# Patient Record
Sex: Female | Born: 1975 | Race: White | Hispanic: Yes | Marital: Married | Smoking: Never smoker
Health system: Southern US, Community
[De-identification: ages and names within clinical notes are randomized; demographics above are authoritative.]

## PROBLEM LIST (undated history)

## (undated) ENCOUNTER — Emergency Department (HOSPITAL_COMMUNITY): Discharge status: Absent without leave | Payer: Self-pay

---

## 2008-10-14 ENCOUNTER — Other Ambulatory Visit: Admission: RE | Admit: 2008-10-14 | Discharge: 2008-10-14 | Payer: Self-pay | Admitting: Gynecology

## 2008-10-14 ENCOUNTER — Encounter: Payer: Self-pay | Admitting: Gynecology

## 2008-10-14 ENCOUNTER — Ambulatory Visit: Payer: Self-pay | Admitting: Gynecology

## 2009-05-21 ENCOUNTER — Inpatient Hospital Stay (HOSPITAL_COMMUNITY): Admission: AD | Admit: 2009-05-21 | Discharge: 2009-06-01 | Payer: Self-pay | Admitting: Obstetrics

## 2009-05-29 ENCOUNTER — Encounter: Payer: Self-pay | Admitting: Obstetrics & Gynecology

## 2010-12-14 LAB — CBC
HCT: 25.3 % — ABNORMAL LOW (ref 36.0–46.0)
HCT: 30.1 % — ABNORMAL LOW (ref 36.0–46.0)
Hemoglobin: 12 g/dL (ref 12.0–15.0)
MCV: 95.5 fL (ref 78.0–100.0)
MCV: 95.4 fL (ref 78.0–100.0)
Platelets: 195 10*3/uL (ref 150–400)
Platelets: 230 10*3/uL (ref 150–400)
RBC: 2.64 MIL/uL — ABNORMAL LOW (ref 3.87–5.11)
RBC: 3.65 MIL/uL — ABNORMAL LOW (ref 3.87–5.11)
RBC: 3.15 MIL/uL — ABNORMAL LOW (ref 3.87–5.11)
WBC: 6.9 10*3/uL (ref 4.0–10.5)
WBC: 8.7 10*3/uL (ref 4.0–10.5)
WBC: 7.7 10*3/uL (ref 4.0–10.5)

## 2010-12-14 LAB — TYPE AND SCREEN
ABO/RH(D): O POS
Antibody Screen: NEGATIVE

## 2010-12-14 LAB — COMPREHENSIVE METABOLIC PANEL
AST: 23 U/L (ref 0–37)
Albumin: 2.5 g/dL — ABNORMAL LOW (ref 3.5–5.2)
Alkaline Phosphatase: 139 U/L — ABNORMAL HIGH (ref 39–117)
BUN: 5 mg/dL — ABNORMAL LOW (ref 6–23)
CO2: 25 mEq/L (ref 19–32)
Chloride: 108 mEq/L (ref 96–112)
Creatinine, Ser: 0.5 mg/dL (ref 0.4–1.2)
GFR calc Af Amer: >60 mL/min (ref >60)
GFR calc non Af Amer: >60 mL/min (ref >60)
Potassium: 3.5 mEq/L (ref 3.5–5.1)
Total Bilirubin: 0.1 mg/dL — ABNORMAL LOW (ref 0.3–1.2)

## 2010-12-14 LAB — URINALYSIS, ROUTINE W REFLEX MICROSCOPIC
Nitrite: NEGATIVE
Protein, ur: NEGATIVE mg/dL
Specific Gravity, Urine: 1.015 (ref 1.005–1.030)
Urobilinogen, UA: 0.2 mg/dL (ref 0.0–1.0)

## 2010-12-14 LAB — MAGNESIUM: Magnesium: 5.5 mg/dL — ABNORMAL HIGH (ref 1.5–2.5)

## 2010-12-14 LAB — ABO/RH: ABO/RH(D): O POS

## 2010-12-14 LAB — DIC (DISSEMINATED INTRAVASCULAR COAGULATION)PANEL
Platelets: 230 10*3/uL (ref 150–400)
Smear Review: NONE SEEN

## 2010-12-14 LAB — RPR: RPR Ser Ql: NONREACTIVE

## 2014-02-28 ENCOUNTER — Ambulatory Visit: Payer: Self-pay | Admitting: Gynecology

## 2014-04-08 ENCOUNTER — Ambulatory Visit (INDEPENDENT_AMBULATORY_CARE_PROVIDER_SITE_OTHER): Payer: Self-pay | Admitting: Gynecology

## 2014-04-08 ENCOUNTER — Other Ambulatory Visit (HOSPITAL_COMMUNITY)
Admission: RE | Admit: 2014-04-08 | Discharge: 2014-04-08 | Disposition: A | Discharge status: Home / Self Care | Payer: Self-pay | Source: Ambulatory Visit | Attending: Gynecology | Admitting: Gynecology

## 2014-04-08 ENCOUNTER — Encounter: Payer: Self-pay | Admitting: Gynecology

## 2014-04-08 VITALS — BP 114/70 | Ht 60.0 in | Wt 127.2 lb

## 2014-04-08 DIAGNOSIS — N979 Female infertility, unspecified: Secondary | ICD-10-CM

## 2014-04-08 DIAGNOSIS — Z01419 Encounter for gynecological examination (general) (routine) without abnormal findings: Secondary | ICD-10-CM

## 2014-04-08 DIAGNOSIS — N926 Irregular menstruation, unspecified: Secondary | ICD-10-CM

## 2014-04-08 DIAGNOSIS — Z1151 Encounter for screening for human papillomavirus (HPV): Secondary | ICD-10-CM | POA: Insufficient documentation

## 2014-04-08 LAB — CBC WITH DIFFERENTIAL/PLATELET
Basophils Absolute: 0 10*3/uL (ref 0.0–0.1)
Basophils Relative: 0 % (ref 0–1)
EOS ABS: 0.1 10*3/uL (ref 0.0–0.7)
EOS PCT: 1 % (ref 0–5)
HCT: 37.7 % (ref 36.0–46.0)
HEMOGLOBIN: 13.3 g/dL (ref 12.0–15.0)
LYMPHS ABS: 1.4 10*3/uL (ref 0.7–4.0)
LYMPHS PCT: 21 % (ref 12–46)
MCH: 31.4 pg (ref 26.0–34.0)
MCHC: 35.3 g/dL (ref 30.0–36.0)
MCV: 88.9 fL (ref 78.0–100.0)
MONOS PCT: 5 % (ref 3–12)
Monocytes Absolute: 0.3 10*3/uL (ref 0.1–1.0)
Neutro Abs: 4.7 10*3/uL (ref 1.7–7.7)
Neutrophils Relative %: 73 % (ref 43–77)
Platelets: 217 10*3/uL (ref 150–400)
RBC: 4.24 MIL/uL (ref 3.87–5.11)
RDW: 13.1 % (ref 11.5–15.5)
WBC: 6.5 10*3/uL (ref 4.0–10.5)

## 2014-04-08 LAB — COMPREHENSIVE METABOLIC PANEL
ALT: 13 U/L (ref 0–35)
AST: 13 U/L (ref 0–37)
Albumin: 4.5 g/dL (ref 3.5–5.2)
Alkaline Phosphatase: 54 U/L (ref 39–117)
BILIRUBIN TOTAL: 0.5 mg/dL (ref 0.2–1.2)
BUN: 6 mg/dL (ref 6–23)
CALCIUM: 9.1 mg/dL (ref 8.4–10.5)
CHLORIDE: 104 meq/L (ref 96–112)
CO2: 25 meq/L (ref 19–32)
CREATININE: 0.64 mg/dL (ref 0.50–1.10)
GLUCOSE: 89 mg/dL (ref 70–99)
Potassium: 3.8 mEq/L (ref 3.5–5.3)
SODIUM: 139 meq/L (ref 135–145)
TOTAL PROTEIN: 6.9 g/dL (ref 6.0–8.3)

## 2014-04-08 LAB — LIPID PANEL
CHOLESTEROL: 141 mg/dL (ref 0–200)
HDL: 51 mg/dL (ref >39)
LDL Cholesterol: 76 mg/dL (ref 0–99)
TRIGLYCERIDES: 70 mg/dL (ref <150)
Total CHOL/HDL Ratio: 2.8 Ratio
VLDL: 14 mg/dL (ref 0–40)

## 2014-04-08 LAB — TSH: TSH: 1.317 u[IU]/mL (ref 0.350–4.500)

## 2014-04-08 MED ORDER — DOXYCYCLINE HYCLATE 100 MG PO CAPS: 100.0000 mg | ORAL_CAPSULE | Freq: Two times a day (BID) | ORAL | Status: DC

## 2014-04-08 NOTE — Patient Instructions (Signed)
Espermograma (Semen Analysis) El espermograma investiga algunos aspectos de la salud de los rganos reproductivos masculinos (testculos), incluidas las posibles causas de esterilidad. Adems, este anlisis puede realizarse para determinar si una vasectoma realizada con anterioridad fue satisfactoria.  El semen es una secrecin blanquecina que se expulsa desde el pene durante la fase final del orgasmo Market researcher) y cerca del final de la relacin sexual o la masturbacin. Est compuesto por lquidos y nutrientes de la prstata, las vesculas seminales y otras glndulas. Tambin contiene espermatozoides de los testculos. Un solo espermatozoide contiene una serie completa del cdigo gentico de un hombre (cromosoma).  PREPARACIN PARA EL ESTUDIO Se tomar Truddie Coco de semen en un recipiente estril de vidrio provisto por el laboratorio, o puede recogerse en un preservativo que no contenga lubricantes ni sustancias qumicas, como espermicidas.  RESULTADOS  Es su responsabilidad retirar el resultado del [place]. Consulte en el laboratorio o en el departamento en el que fue realizado el estudio cundo y cmo podr [employer]. Comunquese con el mdico si tiene [employer] Corporation.  Rango de valores normales  Volumen: 2 a 53m.  Tiempo de licuefaccin: 20 a [id number] de la recoleccin.  Aspecto: normal.  Movilidad/ml: mayor o igual que [id number]  Espermatozoides/ml: mayor o igual que [id number]  Viscosidad: mayor o igual que 3.  Aglutinacin: mayor o igual que 3.  Supravital: mayor o igual que el 75% vivos.  Fructosa: positivo.  pH: 7,12 a 8,00.  Recuento de espermatozoides (densidad): mayor o igual que 274mlones/ml.  Movilidad espermtica: mayor o igual que el 50% a la hora.  Morfologa de los espermatozoides: Mayor que el 30% (criterio de [place] mayor que el 14%) de formas normales. Los rangos para los resultados normales pueden variar  entre diferentes laboratorios y hospitales. Consulte siempre con su mdico despus de haPsychologist, counsellingstudio para coArmed forces logistics/support/administrative officerignificado de los reJohnson [place] si los valores se consideran "dentro de los lmites normales". Significado de los reReynolds Americanl bajo nmero de espermatozoides, su movimiento anormal (movilidad) o su forma anormal (morfologa) pueden causar problemas de esterilidad masculina. El mdico leDu Pont haElectrical engineeron usted sobre la importancia y el significado de los reCaminoas como las opciones de tratamiento y la necesidad de reOptometristruebas adicionales, si fuera necesario.  Document Released: 06/16/2013 Document Revised: 01/10/2014 [facility] LLCatient Information 2015 ExKaw CityThis information is not intended to replace advice given to you by your health care provider. Make sure you discuss any questions you have with your health care provider. Histerosalpingografa (Hysterosalpingography) La histerosalpingografa es un procedimiento para observar el interior del tero y las trompas de FaAkinsDurante este procedimiento, se inyecta una sustancia de contraste en el tero, a travs de la vagina y el cuello del tero para poder visualizar el tero mientras se toman imgenes radiogrficas. Este procedimiento puede ayudar al mdico a diagnosticar tumores, adherencias o anormalidades estructurales en el tero. Generalmente, se utSouth Georgia and the South Sandwich Islandsara determinar las razones por las que una mujer no puede tener hijos (infertilidad). El procedimiento suele durar entre 15 y 3071inutos. INFORME A SU MDICO:  Cualquier alergia que tenga.  Todos los meLyondell Chemicalincluidos vitaminas, hierbas, gotas oftlmicas, cremas y medicamentos de venta libre.  Problemas previos que usted o los miUnitedHealthe su familia hayan tenido con el uso de anestsicos.  Enfermedades de laCampbell Soup Cirugas previas.  Afecciones mdicas que tenga. RIESGOS Y COMPLICACIONES  En general,  se trata de un procedimiento seguro.  Sin embargo, Photographercomo en cualquier procedimiento, pueden surgir problemas. Estos son algunos posibles problemas:  Infeccin en el revestimiento interior del tero (endometritis) o en las trompas de Falopio (salpingitis).  Dao o perforacin del tero o las trompas de Crooked CreekFalopio.  Reaccin alrgica a la sustancia de Samoacontraste utilizada para tomar la radiografa. ANTES DEL PROCEDIMIENTO   Debe planificar el procedimiento para despus que finalice su perodo, pero antes de su prxima ovulacin. Esto ocurre por lo general Centex Corporationentre los das 5 y 10 de su ltimo perodo. El Da 1 es Film/video editorel primer da de su perodo.  Consulte a su mdico si debe cambiar o suspender los medicamentos que toma habitualmente.  Podr comer y beber normalmente.  Antes de comenzar el procedimiento, vace la vejiga. PROCEDIMIENTO  Es posible que le administren un medicamento para Lexicographerrelajarse (sedante) o un analgsico de venta libre para Paramedicaliviar las molestias durante el procedimiento.  Deber acostarse en una mesa de radiografas con los pies en los estribos.  Le colocarn en la vagina un dispositivo llamado espculo. Esto permite al mdico observar el interior de la vagina hasta el cuello del tero.  El cuello del tero se lava con un jabn especial.  Luego se pasa un tubo delgado y flexible a travs del cuello del tero hasta el tero.  Por el tubo se colocar una sustancia de Bernardsvillecontraste.  Le tomarn varias radiografas a medida que el contraste pasa a travs del tero y las trompas de SumnerFalopio.  Despus del procedimiento se retira el tubo. DESPUS DEL PROCEDIMIENTO   La mayor parte de la sustancia de contraste se eliminar de la vagina naturalmente. Puede ser necesario que use un apsito sanitario.  Puede sentir clicos leves y notar una hemorragia vaginal leve. Luego de 24 horas deben desaparecer.  Pregunte cundo Hughes Supplyestarn listos los resultados. Asegrese de Aon Corporationobtener los  resultados. Document Released: 11/18/2011 Document Revised: 08/31/2013 [facility] Patient Information 2015 [place] RunExitCare, [place]LL[place]. This information is not intended to replace advice given to you by your health care provider. Make sure you discuss any questions you have with your health care provider.

## 2014-04-08 NOTE — Addendum Note (Signed)
Addended by: Richardson ChiquitoWILKINSON, KARI S on: 04/08/2014 12:18 PM   Modules accepted: Orders

## 2014-04-08 NOTE — Progress Notes (Signed)
Cori RazorOlga Lacroix [birth year] [id number]   History:    38 y.o.  for annual gyn exam whose last gynecological examination [facility]. Patient states that she has never had any abnormal Pap smear. Patient had a cesarean section in 2010 at [redacted] weeks gestation for placenta previa. Since then she has had irregular cycles which very in between 21 and 35 days and has had no form of contraception would like to get pregnant. She had been on Depo-Provera until approximately 2 years ago.  Past medical history,surgical history, family history and social history were all reviewed and documented in the EPIC chart.  Gynecologic History Patient's last menstrual period was 03/24/2014. Contraception: none Last Pap: 2013. Results were: normal Last mammogram: Not indicated. Results were: Not indicated  Obstetric History OB History  Gravida Para Term Preterm AB SAB TAB Ectopic Multiple Living  1 1  1      1     # Outcome Date GA Lbr Len/2nd Weight Sex Delivery Anes PTL Lv  1 PRE     F LTCS          ROS: A ROS was performed and pertinent positives and negatives are included in the history.  GENERAL: No fevers or chills. HEENT: No change in vision, no earache, sore throat or sinus congestion. NECK: No pain or stiffness. CARDIOVASCULAR: No chest pain or pressure. No palpitations. PULMONARY: No shortness of breath, cough or wheeze. GASTROINTESTINAL: No abdominal pain, nausea, vomiting or diarrhea, melena or bright red blood per rectum. GENITOURINARY: No urinary frequency, urgency, hesitancy or dysuria. MUSCULOSKELETAL: No joint or muscle pain, no back pain, no recent trauma. DERMATOLOGIC: No rash, no itching, no lesions. ENDOCRINE: No polyuria, polydipsia, no heat or cold intolerance. No recent change in weight. HEMATOLOGICAL: No anemia or easy bruising or bleeding. NEUROLOGIC: No headache, seizures, numbness, tingling or weakness. PSYCHIATRIC: No depression, no loss of interest in normal  activity or change in sleep pattern.     Exam: chaperone present  BP 114/70  Ht 5' (1.524 m)  Wt 127 lb 3.2 oz (57.698 kg)  BMI 24.84 kg/m2  LMP 03/24/2014  Body mass index is 24.84 kg/(m^2).  General appearance : Well developed well nourished female. No acute distress HEENT: Neck supple, trachea midline, no carotid bruits, no thyroidmegaly Lungs: Clear to auscultation, no rhonchi or wheezes, or rib retractions  Heart: Regular rate and rhythm, no murmurs or gallops Breast:Examined in sitting and supine position were symmetrical in appearance, no palpable masses or tenderness,  no skin retraction, no nipple inversion, no nipple discharge, no skin discoloration, no axillary or supraclavicular lymphadenopathy Abdomen: no palpable masses or tenderness, no rebound or guarding Extremities: no edema or skin discoloration or tenderness  Pelvic:  Bartholin, Urethra, Skene Glands: Within normal limits             Vagina: No gross lesions or discharge  Cervix: No gross lesions or discharge  Uterus  anteverted, normal size, shape and consistency, non-tender and mobile  Adnexa  Without masses or tenderness  Anus and perineum  normal   Rectovaginal  normal sphincter tone without palpated masses or tenderness             Hemoccult not indicated   Cervix required no dilatation of the endocervical canal to facilitate insertion of the Cytobrush for appropriate sampling for Pap smear.  Assessment/Plan:  38 y.o. female for annual exam with the regular cycles/oligomenorrhea. We're going to check a CBC, TSH, prolactin,  fasting lipid profile, comprehensive metabolic panel. She will call the office at the start of her next menstrual cycle so that we can coordinate a schedule an HSG as well as a semen analysis. They were then come to the office to see me 1-2 weeks after above studies to discuss course of management such as with ovulation induction medication. She was also provided with a prescription for  Vibramycin 100 mg for her to take twice a day for 3 days starting the day before the HSG. She was reminded on the importance of prenatal vitamins for neural tube defect prophylaxis. All the above was discussed in Spanish as well as written information provided. Pap smear was done today.  Note: This dictation was prepared with  Dragon/digital dictation along withSmart phrase technology. Any transcriptional errors that result from this process are unintentional.   Ok Edwards MD, 9:57 AM 04/08/2014

## 2014-04-09 LAB — URINALYSIS W MICROSCOPIC + REFLEX CULTURE
Bilirubin Urine: NEGATIVE
CASTS: NONE SEEN
CRYSTALS: NONE SEEN
GLUCOSE, UA: NEGATIVE mg/dL
HGB URINE DIPSTICK: NEGATIVE
Ketones, ur: NEGATIVE mg/dL
LEUKOCYTES UA: NEGATIVE
NITRITE: NEGATIVE
PH: 7 (ref 5.0–8.0)
Protein, ur: NEGATIVE mg/dL
SQUAMOUS EPITHELIAL / LPF: NONE SEEN
Urobilinogen, UA: 0.2 mg/dL (ref 0.0–1.0)

## 2014-04-09 LAB — PROLACTIN: PROLACTIN: 7.1 ng/mL

## 2014-04-11 LAB — URINE CULTURE: Colony Count: >=100000

## 2014-04-12 ENCOUNTER — Other Ambulatory Visit: Payer: Self-pay | Admitting: Anesthesiology

## 2014-04-12 LAB — CYTOLOGY - PAP

## 2014-04-12 MED ORDER — NITROFURANTOIN MONOHYD MACRO 100 MG PO CAPS: 100.0000 mg | ORAL_CAPSULE | Freq: Two times a day (BID) | ORAL | Status: DC

## 2014-04-19 ENCOUNTER — Telehealth: Payer: Self-pay | Admitting: *Deleted

## 2014-04-19 DIAGNOSIS — N979 Female infertility, unspecified: Secondary | ICD-10-CM

## 2014-04-19 NOTE — Telephone Encounter (Signed)
[facility] she will need to come on Monday 04/25/14 to pay $879.00 in order to have procedure on Tuesday. No sex and Vibramycin 100 mg for her to take twice a day for 3 days starting the day before the HSG. Debarah CrapeClaudia will relay to patient.

## 2014-04-19 NOTE — Telephone Encounter (Signed)
Message copied by Aura CampsWEBB, Patrcia Schnepp L on Tue Apr 19, 2014  3:46 PM ------      Message from: Jerilynn MagesSHAFFER, CLAUDIA      Created: Tue Apr 19, 2014  2:37 PM      Regarding: HSG       Please schedule HSG for JF patient. Her LMP 04-19-14. She prefers morning appointment but not on Wed Aug 19. Thx ------

## 2014-04-26 ENCOUNTER — Ambulatory Visit (HOSPITAL_COMMUNITY)
Admission: RE | Admit: 2014-04-26 | Discharge: 2014-04-26 | Disposition: A | Discharge status: Home / Self Care | Payer: Self-pay | Source: Ambulatory Visit | Attending: Gynecology | Admitting: Gynecology

## 2014-04-26 DIAGNOSIS — N979 Female infertility, unspecified: Secondary | ICD-10-CM | POA: Insufficient documentation

## 2014-04-26 MED ORDER — IOHEXOL 300 MG/ML  SOLN
20.0000 mL | Freq: Once | INTRAMUSCULAR | Status: AC | PRN
  Administered 2014-04-26: 20 mL

## 2014-04-27 ENCOUNTER — Encounter: Payer: Self-pay | Admitting: Gynecology

## 2014-05-10 ENCOUNTER — Encounter: Payer: Self-pay | Admitting: Gynecology

## 2014-05-10 ENCOUNTER — Ambulatory Visit (INDEPENDENT_AMBULATORY_CARE_PROVIDER_SITE_OTHER): Payer: Self-pay | Admitting: Gynecology

## 2014-05-10 VITALS — BP 130/80

## 2014-05-10 DIAGNOSIS — N970 Female infertility associated with anovulation: Secondary | ICD-10-CM

## 2014-05-10 MED ORDER — CLOMIPHENE CITRATE 50 MG PO TABS: ORAL_TABLET | ORAL | Status: DC

## 2014-05-10 NOTE — Progress Notes (Signed)
   87 patient who presented to the office today with her husband to discuss previous ordered test as part of her evaluation for secondary infertility. See previous note dated 04/08/2014. Recent evaluation demonstrated the following:  CBC, comprehensive metabolic panel, TSH, prolactin, and Pap smear was normal.  Semen analysis HSG were both normal.  The patient has irregular cycles whereby they occur anywhere between 21-35 days. Patient states she had difficulty getting pregnant with her first child but had no infertility evaluation or treatment until now.  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 We discussed the synchronization of her cycles with ovulation induction medication such as clomiphene citrate 100 mg daily from day 5-9 of her cycle. She will utilize the ovulation predictor kit from day 12-16 to time or intercourse. She will return to the office on day 20 or 21 or 22 for serum progesterone level. The risks benefits and pros and cons of ovulation induction medication were discussed to include hyperstimulation and multiple gestation. Instructions and literature were provided in Spanish. We also did discuss the risk involved with advanced maternal age to include aneuploidy, hypertension, diabetes, premature delivery and miscarriage.

## 2014-05-10 NOTE — Patient Instructions (Signed)
Clomiphene tablets Qu es este medicamento? El CLOMIFENO es un medicamento para la fertilidad que se [place] para aumentar la posibilidad de [place]. [place] para estimular Nurse, children's (producir un huevo maduro) adecuada durante el ciclo de la [place]. Este medicamento puede ser utilizado para otros usos; si tiene alguna pregunta consulte con su proveedor de atencin mdica o con su farmacutico. MARCAS COMERCIALES DISPONIBLES: Clomid, Serophene Qu le debo informar a mi profesional de la salud antes de tomar este medicamento? Necesita saber si usted presenta alguno de los siguientes problemas o situaciones: -enfermedad de la glndula suprarrenal -enfermedad vascular, trastorno de coagulacin sangunea -quiste en los ovarios -endometriosis -enfermedad heptica -carcinoma de ovario -enfermedad de la glndula pituitaria -sangrado vaginal que no ha sido evaluado -una reaccin alrgica o inusual al clomifeno, a otros medicamentos, alimentos, colorantes o conservantes -si est embarazada (no debe usar si est embarazada) -si est amamantando a un beb Cmo debo utilizar este medicamento? Tome este medicamento por va oral con un vaso de agua. Siga las instrucciones de la etiqueta del [place]. Tomar exactamente segn se indica y durante el nmero exacto de das para los que fue recetado. Tome sus dosis a intervalos regulares. La State Farm de las mujeres toman este medicamento durante un perodo de 5 [place], Armed forces training and education officer la duracin del tratamiento puede ajustarse en algunos casos. Su mdico le Conservation officer, nature en que debe empezar a tomar este medicamento y Ship broker las indicaciones para Tour manager. No tome su medicamento con una frecuencia mayor que la indicada. Hable con su pediatra para informarse acerca del uso de este medicamento en nios. Puede requerir atencin especial. Sobredosis: Pngase en contacto inmediatamente con un centro toxicolgico o una sala de urgencia si usted cree que  haya tomado demasiado medicamento. ATENCIN: ConAgra Foods es solo para usted. No comparta este medicamento con nadie. Qu sucede si me olvido de una dosis? Si olvida una dosis, tmela lo antes posible. Si es casi la hora de la prxima dosis, tome slo esa dosis. No tome dosis adicionales o dobles. Qu puede interactuar con este medicamento? -suplementos a base de hierbas o dietticos como cohosh azul, cohosh negro, vitex o DHEA -prasterona Puede ser que esta lista no menciona todas las posibles interacciones. Informe a su profesional de KB Home	[place] de [employer] Corporation productos a base de hierbas, medicamentos de [place] o suplementos nutritivos que est tomando. Si usted fuma, consume bebidas alcohlicas o si utiliza drogas ilegales, indqueselo tambin a su profesional de KB Home	[place]. Algunas sustancias pueden interactuar con su medicamento. A qu debo estar atento al usar Coca-Cola? Asegrese que usted sepa cmo y cundo usar Coca-Cola. Debe saber cundo est ovulando y cundo debe Boeing sexuales a fin de incrementar las posibilidades de quedar [place]. Visite a su mdico o a su profesional de la salud para chequear su evolucin peridicamente. Es posible que deba controlar sus niveles de hormonas en la sangre o que le indique alguna prueba de orina domiciliaria para Aeronautical engineer ovulacin. Trate de no faltar a las citas. En comparacin con otros tratamientos para la fertilidad, este medicamento no aumenta mucho la posibilidad de Best boy un Geneticist, molecular. Aproximadamente 5 de cada 100 mujeres que toman este medicamento tienen la posibilidad de quedar embarazadas con SPX Corporation. Si piensa que est embarazada deje de tomar este medicamento de inmediato y comunquese con su mdico o con su profesional de Technical sales engineer. Este medicamento no es para tratamientos a Barrister's clerk. La mayora de las mujeres que se  benefician del uso de este medicamento obtienen resultados dentro de los tres  primeros ciclos (meses). Su mdico o su profesional de la salud volver a evaluar su problema. Este medicamento generalmente se utiliza durante no ms de 6 ciclos de tratamiento. Puede experimentar mareos o somnolencia. No conduzca ni utilice maquinaria ni haga nada que le exija permanecer en estado de alerta hasta que sepa cmo le afecta este medicamento. No se siente ni se ponga de pie con rapidez. Esto reduce el riesgo de mareos o desmayos. El consumir bebidas alcohlicas o el fumar tabaco puede disminuir las posibilidades de quedar embarazada. Limitar o dejar de consumir alcohol o el uso de tabaco durante su tratamiento para la fertilidad. Qu efectos secundarios puedo tener al utilizar este medicamento? Efectos secundarios que debe informar a su mdico o a su profesional de la salud tan pronto como sea posible: -reacciones alrgicas como erupcin cutnea, picazn o urticarias, hinchazn de la cara, labios o lengua -problemas respiratorios -cambios en la visin -retencin de lquidos -nuseas, vmito -hinchazn o dolor plvico -dolor abdominal severo -aumento de peso repentino Efectos secundarios que, por lo general, no requieren atencin mdica (debe informarlos a su mdico o a su profesional de la salud si persisten o si son molestos): -molestia en las mamas -sofocos -molestia plvica leve -nuseas leves Puede ser que esta lista no menciona todos los posibles efectos secundarios. Comunquese a su mdico por asesoramiento mdico sobre los efectos secundarios. Usted puede informar los efectos secundarios a la FDA por telfono al 1-800-FDA-1088. Dnde debo guardar mi medicina? Mantngala fuera del alcance de los nios. Gurdela a temperatura ambiente, entre 15 y 30 grados C (59 y 86 grados F). Protjala del calor, la luz y la humedad. Deseche todo el medicamento que no haya utilizado, despus de la fecha de vencimiento. ATENCIN: Este folleto es un resumen. Puede ser que no cubra toda la  posible informacin. Si usted tiene preguntas acerca de esta medicina, consulte con su mdico, su farmacutico o su profesional de la salud.  2015, Elsevier/Gold Standard. (2007-02-10 11:32:00)  

## 2014-06-01 ENCOUNTER — Other Ambulatory Visit: Payer: Self-pay

## 2014-06-01 DIAGNOSIS — N970 Female infertility associated with anovulation: Secondary | ICD-10-CM

## 2014-06-01 LAB — PROGESTERONE: PROGESTERONE: 45.9 ng/mL

## 2014-07-11 ENCOUNTER — Encounter: Payer: Self-pay | Admitting: Gynecology

## 2014-11-02 ENCOUNTER — Encounter: Payer: Self-pay | Admitting: Gynecology

## 2014-11-02 ENCOUNTER — Ambulatory Visit (INDEPENDENT_AMBULATORY_CARE_PROVIDER_SITE_OTHER): Payer: Self-pay | Admitting: Gynecology

## 2014-11-02 VITALS — BP 120/80

## 2014-11-02 DIAGNOSIS — N970 Female infertility associated with anovulation: Secondary | ICD-10-CM

## 2014-11-02 MED ORDER — LETROZOLE 2.5 MG PO TABS: ORAL_TABLET | ORAL | Status: DC

## 2014-11-02 MED ORDER — DEXAMETHASONE 0.5 MG PO TABS: ORAL_TABLET | ORAL | Status: DC

## 2014-11-02 NOTE — Progress Notes (Signed)
   Patient is a 39 year old gravida 1 para 1 who presented to the office today to discuss management of her infertility. She was seen in the office for the first time on July 31 whereby she had been reporting that her cycles were irregular fluctuating between every 21-35 days. Patient then later returned to the office on September 1 to discuss the following:  CBC, comprehensive metabolic panel, TSH, prolactin, and Pap smear all were normal.  HSG confirming bilateral tubal patency and normal semen analysis.  Previous pregnancy was with same partner. For her oligomenorrhea/irregular cycles she was started on clomiphene citrate 100 mg day 5 through 9 with timing of her cycle with ovulation predictor kit. She stated that she did this for 4 months noted a change in the ovulation predictor kit that did not conceive. We did do a serum progesterone level during mid luteal phase and was 45.9 indicative of ovulation.  Blood pressure today was 120/80  We had a lengthy discussion today of different treatment options to include the following: It appears that she is ovulating but perhaps she may have luteinized unruptured follicle (LUF) which may be affecting her conception. We discussed starting her on dexamethasone 0.5 mg daily at bedtime from day 2-18 of her cycle and starting a different ovulation induction medication such as Femera 2.5 mg from day 3 to day 7 along with timing of intercourse on ovulation predictor kit from day 10 to day 18. We'll try this for 3 months and if she does not conceive she will then continue to use the ovulation predictor kit and timing of intercourse for the next 3 months and then she will see me for her annual exam and we will touch base at that time. We discussed the concerns about her advanced maternal age and potential risk such as infertility, hypertension, diabetes, aneuploidy, and risk of premature delivery. All these issues were discussed with the patient Spanish and greater  than 50% of the time was spent on counseling and coordinate eating care will as to infertility management on this patient with ovulatory dysfunction. The risks benefits and pros and cons of the medications were discussed.

## 2015-06-01 ENCOUNTER — Emergency Department (HOSPITAL_COMMUNITY)
Admission: EM | Admit: 2015-06-01 | Discharge: 2015-06-01 | Disposition: A | Discharge status: Home / Self Care | Payer: Self-pay | Attending: Physician Assistant | Admitting: Physician Assistant

## 2015-06-01 ENCOUNTER — Encounter (HOSPITAL_COMMUNITY): Payer: Self-pay

## 2015-06-01 ENCOUNTER — Emergency Department (HOSPITAL_COMMUNITY): Payer: Self-pay

## 2015-06-01 ENCOUNTER — Ambulatory Visit (INDEPENDENT_AMBULATORY_CARE_PROVIDER_SITE_OTHER): Payer: Self-pay | Admitting: Family Medicine

## 2015-06-01 VITALS — BP 118/78 | HR 80 | Temp 98.6°F | Resp 16 | Ht 60.5 in | Wt 125.0 lb

## 2015-06-01 DIAGNOSIS — K219 Gastro-esophageal reflux disease without esophagitis: Secondary | ICD-10-CM

## 2015-06-01 DIAGNOSIS — R42 Dizziness and giddiness: Secondary | ICD-10-CM

## 2015-06-01 DIAGNOSIS — R079 Chest pain, unspecified: Secondary | ICD-10-CM

## 2015-06-01 DIAGNOSIS — Z7982 Long term (current) use of aspirin: Secondary | ICD-10-CM | POA: Insufficient documentation

## 2015-06-01 DIAGNOSIS — R0602 Shortness of breath: Secondary | ICD-10-CM

## 2015-06-01 DIAGNOSIS — Z3202 Encounter for pregnancy test, result negative: Secondary | ICD-10-CM | POA: Insufficient documentation

## 2015-06-01 LAB — BASIC METABOLIC PANEL
Anion gap: 12 (ref 5–15)
BUN: 7 mg/dL (ref 6–20)
CALCIUM: 9.1 mg/dL (ref 8.9–10.3)
CO2: 23 mmol/L (ref 22–32)
CREATININE: 0.67 mg/dL (ref 0.44–1.00)
Chloride: 104 mmol/L (ref 101–111)
Glucose, Bld: 90 mg/dL (ref 65–99)
Potassium: 3.7 mmol/L (ref 3.5–5.1)
Sodium: 139 mmol/L (ref 135–145)

## 2015-06-01 LAB — POCT GLYCOSYLATED HEMOGLOBIN (HGB A1C): HEMOGLOBIN A1C: 5.1

## 2015-06-01 LAB — CBC
HCT: 38.7 % (ref 36.0–46.0)
Hemoglobin: 13.6 g/dL (ref 12.0–15.0)
MCH: 31.4 pg (ref 26.0–34.0)
MCHC: 35.1 g/dL (ref 30.0–36.0)
MCV: 89.4 fL (ref 78.0–100.0)
PLATELETS: 208 10*3/uL (ref 150–400)
RBC: 4.33 MIL/uL (ref 3.87–5.11)
RDW: 12.5 % (ref 11.5–15.5)
WBC: 5.1 10*3/uL (ref 4.0–10.5)

## 2015-06-01 LAB — I-STAT BETA HCG BLOOD, ED (MC, WL, AP ONLY)

## 2015-06-01 LAB — TROPONIN I

## 2015-06-01 MED ORDER — MECLIZINE HCL 50 MG PO TABS: 50.0000 mg | ORAL_TABLET | Freq: Three times a day (TID) | ORAL | Status: AC | PRN

## 2015-06-01 MED ORDER — OMEPRAZOLE 20 MG PO CPDR: 20.0000 mg | DELAYED_RELEASE_CAPSULE | Freq: Every day | ORAL | Status: AC

## 2015-06-01 NOTE — Progress Notes (Signed)
    MRN: [id number] DOB: [birth year]  Subjective:   Amber Allison is a 39 y.o. female presenting for chief complaint of Dizziness and Shortness of Breath  Reports ~1 month history of intermittent dizziness, vertigo and agitation, occasionally associated with neck pain. Dizziness generally lasts less than 30 minutes and is relieved on its own. Admits that her dizziness occurs after meals more often than not. Reports unhealthy diet, no exercise, hydrates very well. She is worried that 5 days ago, she has had 2 episodes of shob, chest pain, left arm numbness. These episodes lasted ~1 hour, she tried aspirin for 2nd episode with some relief. Today, she has had dizziness all morning but denies chest pain, shob, n/v, abdominal pain, diaphoresis, jaw pain, left arm pain, numbness or tingling. Also denies headache, double vision, blurred vision, polydipsia, polyuria, hematuria, dysuria. Denies any other aggravating or relieving factors, no other questions or concerns.  Amber Allison currently has no medications in their medication list. Also has No Known Allergies.  Amber Allison  has no past medical history on file. Also  has past surgical history that includes Cesarean section.  Objective:   Vitals: BP 118/78 mmHg  Pulse 80  Temp(Src) 98.6 F (37 C) (Oral)  Resp 16  Ht 5' 0.5" (1.537 m)  Wt 125 lb (56.7 kg)  BMI 24.00 kg/m2  SpO2 98%  LMP 05/15/2015  Physical Exam  Constitutional: She is oriented to person, place, and time. She appears well-developed and well-nourished.  HENT:  Mouth/Throat: Oropharynx is clear and moist.  Eyes: Pupils are equal, round, and reactive to light. No scleral icterus.  Neck: Normal range of motion. Neck supple. No thyromegaly present.  Cardiovascular: Normal rate, regular rhythm and intact distal pulses.  Exam reveals no gallop and no friction rub.   No murmur heard. Pulmonary/Chest: No respiratory distress. She has no wheezes. She has no rales.  Abdominal: Soft. Bowel sounds are  normal. She exhibits no distension and no mass. There is no tenderness.  Musculoskeletal: She exhibits no edema.  Lymphadenopathy:    She has no cervical adenopathy.  Neurological: She is alert and oriented to person, place, and time.  Skin: Skin is warm and dry. No rash noted. No erythema. No pallor.   ECG interpretation by Dr. Patsy Lager and PA-Mani: T-wave inversion in leads V1-V2, q-wave in Lead III. No prior ECG for comparison.  Results for orders placed or performed in visit on 06/01/15 (from the past 24 hour(s))  POCT glycosylated hemoglobin (Hb A1C)     Status: None   Collection Time: 06/01/15 10:45 AM  Result Value Ref Range   Hemoglobin A1C 5.1    Assessment and Plan :   1. Chest pain, unspecified chest pain type 2. SOB (shortness of breath) 3. Dizziness - Patient's symptoms and HPI are worrisome for ACS. Diabetes screen was negative. I counseled her on this diagnosis and advised patient go via EMS to the emergency department. Patient declined. She states she will go to [facility] with her husband.   Wallis Bamberg, PA-C [facility] [phone] 06/01/2015 10:14 AM

## 2015-06-01 NOTE — ED Provider Notes (Signed)
CSN: [id number]     Arrival date & time 06/01/15  1122 History   First MD Initiated Contact with Patient 06/01/15 1221     No chief complaint on file.    (Consider location/radiation/quality/duration/timing/severity/associated sxs/prior Treatment) Patient is a 39 y.o. female presenting with chest pain.  Chest Pain Pain location:  Substernal area Pain quality: sharp   Pain radiates to:  L arm Pain severity:  Severe Onset quality:  Gradual Duration:  5 days Timing:  Intermittent Progression:  Waxing and waning Chronicity:  New Context: at rest   Relieved by:  None tried Worsened by:  Nothing tried Ineffective treatments:  None tried Associated symptoms: nausea and shortness of breath   Associated symptoms: no abdominal pain, no cough, no diaphoresis, no fever, no headache, no near-syncope and not vomiting   Associated symptoms comment:  Left arm pain Risk factors: no birth control, no coronary artery disease, no diabetes mellitus, no high cholesterol, no hypertension, no immobilization and no smoking     History reviewed. No pertinent past medical history. Past Surgical History  Procedure Laterality Date  . Cesarean section     Family History  Problem Relation Age of Onset  . Hypertension Mother    Social History  Substance Use Topics  . Smoking status: Never Smoker   . Smokeless tobacco: Never Used  . Alcohol Use: No   OB History    Gravida Para Term Preterm AB TAB SAB Ectopic Multiple Living   Review of Systems  Constitutional: Negative for fever, chills and diaphoresis.  HENT: Negative for congestion and sore throat.   Eyes: Negative for visual disturbance.  Respiratory: Positive for shortness of breath. Negative for cough and wheezing.   Cardiovascular: Positive for chest pain. Negative for leg swelling and near-syncope.  Gastrointestinal: Positive for nausea. Negative for vomiting, abdominal pain, diarrhea and constipation.  Genitourinary:  Negative for dysuria, difficulty urinating and vaginal pain.  Musculoskeletal: Negative for myalgias and arthralgias.  Skin: Negative for rash.  Neurological: Negative for syncope and headaches.  Psychiatric/Behavioral: Negative for behavioral problems.  All other systems reviewed and are negative.     Allergies  Review of patient's allergies indicates no known allergies.  Home Medications   Prior to Admission medications   Medication Sig Start Date End Date Taking? Authorizing Provider  aspirin EC 81 MG tablet Take 324 mg by mouth daily.   Yes Historical Provider, MD  meclizine (ANTIVERT) 50 MG tablet Take 1 tablet (50 mg total) by mouth 3 (three) times daily as needed. 06/01/15   Beverely Risen, MD  omeprazole (PRILOSEC) 20 MG capsule Take 1 capsule (20 mg total) by mouth daily. 06/01/15   Beverely Risen, MD   BP 118/75 mmHg  Pulse 78  Temp(Src) 98.7 F (37.1 C) (Oral)  Resp 18  Ht 5' (1.524 m)  Wt 125 lb (56.7 kg)  BMI 24.41 kg/m2  SpO2 100%  LMP 05/15/2015 Physical Exam  Constitutional: She is oriented to person, place, and time. She appears well-developed and well-nourished. No distress.  HENT:  Head: Normocephalic and atraumatic.  Eyes: EOM are normal.  Neck: Normal range of motion.  Cardiovascular: Normal rate, regular rhythm and normal heart sounds.   No murmur heard. Pulmonary/Chest: Effort normal and breath sounds normal. No respiratory distress. She has no wheezes.  Abdominal: Soft. There is no tenderness.  Musculoskeletal: She exhibits no edema.  Neurological: She is alert and oriented  to person, place, and time. She has normal strength and normal reflexes. No cranial nerve deficit or sensory deficit. She displays a negative Romberg sign. Coordination and gait normal. GCS eye subscore is 4. GCS verbal subscore is 5. GCS motor subscore is 6.  Skin: She is not diaphoretic.  Psychiatric: She has a normal mood and affect. Her behavior is normal.    ED Course   Procedures (including critical care time) Labs Review Labs Reviewed  BASIC METABOLIC PANEL  CBC  TROPONIN I  I-STAT BETA HCG BLOOD, ED ([facility], [facility], [facility] ONLY)    Imaging Review Dg Chest 2 View  06/01/2015   CLINICAL DATA:  Chest pain and shortness of breath for 4 days.  EXAM: CHEST  2 VIEW  COMPARISON:  None.  FINDINGS: The heart size and mediastinal contours are within normal limits. Both lungs are clear. No evidence of pneumothorax or pleural effusion. The visualized skeletal structures are unremarkable.  IMPRESSION: Negative.  No active cardiopulmonary disease.   Electronically Signed   By: Myles Rosenthal M.D.   On: 06/01/2015 13:53   I have personally reviewed and evaluated these images and lab results as part of my medical decision-making.   EKG Interpretation None      MDM   Final diagnoses:  Gastroesophageal reflux disease without esophagitis  Vertigo     Patient is a 39 year old female that presents with one year of intermittent epigastric pain worse after lying flat and eating with a bitter taste in her mouth in the morning and intermittent cough consistent with GERD. Patient also presents with 5 days of intermittent vertigo worse with head movement. On arrival to the ED there is confusion as the patient is Spanish-speaking only and it was interpreted as the patient was currently having chest pain. A chest pain workup was done which was unremarkable. Patient's EKG at urgent care showed indeterminate T waves in the septal leads however repeat EKG here is normal sinus rhythm without any ischemic change and not concerning for a septal infarct. Patient's labs for ACS are all unremarkable. On physical exam the patient has a completely normal neurologic exam. A Dix-Hallpike was performed which reproduced the patient's symptoms were going to the left however there was no nystagmus. His felt this is likely BPV. We will give the patient information establish with a PCP, meclizine, start on a acid  reflux regimen.    Beverely Risen, MD 06/01/15 1545  Courteney Randall An, MD 06/01/15 1616

## 2015-06-01 NOTE — Patient Instructions (Signed)
Dolor de pecho (no específico) °(Chest Pain (Nonspecific)) °Con frecuencia es difícil dar un diagnóstico específico de la causa del dolor de pecho. Siempre hay una posibilidad de que el dolor podría estar relacionado con algo grave, como un ataque al corazón o un coágulo sanguíneo en los pulmones. Debe someterse a controles con el médico para más evaluaciones. °CAUSAS  °· Acidez. °· Neumonía o bronquitis. °· Ansiedad o estrés. °· Inflamación de la zona que rodea al corazón (pericarditis) o a los pulmones (pleuritis o pleuresía). °· Un coágulo sanguíneo en el pulmón. °· Colapso de un pulmón (neumotórax), que puede aparecer de manera repentina por sí solo (neumotórax espontáneo) o debido a un traumatismo en el tórax. °· Culebrilla (virus del herpes zóster). °La pared torácica está compuesta por huesos, músculos y cartílago. Cualquiera de estos puede ser la fuente del dolor. °· Puede haber una contusión en los huesos debido a una lesión. °· Puede haber un esguince en los músculos o el cartílago ocasionado por la tos o por trabajo excesivo. °· El cartílago puede verse afectado por una inflamación y provocar dolor (costocondritis). °DIAGNÓSTICO  °Quizás se necesiten análisis de laboratorio u otros estudios para encontrar la causa del dolor. Además, puede indicarle que se haga una prueba llamada electrocadiograma (ECG) ambulatorio. El ECG registra los patrones de los latidos cardíacos durante 24 horas. Además, pueden hacerle otros estudios, por ejemplo: °· Ecocardiograma transtorácico (ETT). Durante el ecocardiograma, se usan ondas sonoras para evaluar el flujo de la sangre a través del corazón. °· Ecocardiograma transesofágico (ETE). °· Monitoreo cardíaco. Permite que el médico controle la frecuencia y el ritmo cardíaco en tiempo real. °· Monitor Holter. Es un dispositivo portátil que registra los latidos cardíacos y ayuda a diagnosticar las arritmias cardíacas. Le permite al médico registrar la actividad cardíaca  durante varios días, si es necesario. °· Pruebas de estrés por ejercicio o por medicamentos que aceleran los latidos cardíacos. °TRATAMIENTO  °· El tratamiento depende de la causa del dolor de pecho. El tratamiento puede incluir: °¨ Inhibidores de la acidez estomacal. °¨ Antiinflamatorios. °¨ Analgésicos para las enfermedades inflamatorias. °¨ Antibióticos, si hay una infección. °· Podrán aconsejarle que modifique su estilo de vida. Esto incluye dejar de fumar y evitar el alcohol, la cafeína y el chocolate. °· Pueden aconsejarle que mantenga la cabeza levantada (elevada) cuando duerme. Esto reduce la probabilidad de que el ácido retroceda del estómago al esófago. °En la mayoría de los casos, el dolor de pecho no específico mejorará en el término de 2 a 3 días, con reposo y analgésicos suaves.  °INSTRUCCIONES PARA EL CUIDADO EN EL HOGAR  °· Si le prescriben antibióticos, tómelos tal como se le indicó. Termínelos aunque comience a sentirse mejor. °· Durante los días siguientes, no haga actividades físicas que provoquen dolor de pecho. Continúe con las actividades físicas tal como se le indicó °· No consuma ningún producto que contenga tabaco, incluidos cigarrillos, tabaco de mascar o cigarrillos electrónicos. °· Evite el consumo de alcohol. °· Tome los medicamentos solamente como se lo haya indicado el médico. °· Siga las sugerencias del médico en lo que respecta a las pruebas adicionales, si el dolor de pecho no desaparece. °· Concurra a todas las visitas de control programadas. Si no lo hace, podría desarrollar problemas permanentes (crónicos) relacionados con el dolor. Si hay algún problema para concurrir a una cita, llame para reprogramarla. °SOLICITE ATENCIÓN MÉDICA SI:  °· El dolor de pecho no desaparece, incluso después del tratamiento. °· Tiene una erupción cutánea con ampollas en el   pecho. °· Tiene fiebre. °SOLICITE ATENCIÓN MÉDICA DE INMEDIATO SI:  °· Aumenta el dolor de pecho o este se irradia hacia el  brazo, el cuello, la mandíbula, la espalda o el abdomen. °· Le falta el aire. °· La tos empeora, o expectora sangre. °· Siente dolor intenso en la espalda o el abdomen. °· Se siente nauseoso o vomita. °· Siente debilidad intensa. °· Se desmaya. °· Tiene escalofríos. °Esto es una emergencia. No espere a ver si el dolor se pasa. Obtenga ayuda médica de inmediato. Llame a los servicios de emergencia locales (911 en los [place]). No conduzca por sus propios medios hasta el hospital. °ASEGÚRESE DE QUE:  °· Comprende estas instrucciones. °· Controlará su afección. °· Recibirá ayuda de inmediato si no mejora o si empeora. °Document Released: 08/26/2005 Document Revised: 08/31/2013 °ExitCare® Patient Information ©2015 [employer]. This information is not intended to replace advice given to you by your health care provider. Make sure you discuss any questions you have with your health care provider. ° °

## 2015-06-01 NOTE — ED Notes (Signed)
Pt presents with report of chest pain and shortness of breath that began on Sunday.  Pain is intermittent and reports pain radiates into L arm.  Pt seen at Urgent Care and referred here.

## 2015-06-01 NOTE — Discharge Instructions (Signed)
Vértigo postural benigno °(Benign Positional Vertigo) ° Vértigo es la sensación de que el entorno se mueve estando quieto. Es la forma más frecuente de vértigo. Benigno significa que la causa del trastorno no es grave. Es más frecuente en adultos mayores. °CAUSAS  °Es el resultado de un trastorno en el sistema laberíntico. Es una zona en el oído medio que ayuda a controlar el equilibrio. La causa puede ser una infección viral, una lesión en la cabeza o un movimiento repetitivo. Sin embargo, a menudo no se halla causa.  °SÍNTOMAS  °Los síntomas de vértigo posicional benigno se producen al mover la cabeza o los ojos en diferentes direcciones. Algunos de los síntomas pueden ser:  °· Pérdida de equilibrio y caídas. °· Vómitos. °· Visión borrosa. °· Mareos. °· Náuseas. °· Movimientos oculares involuntarios (nistagmus). °DIAGNÓSTICO  °El vértigo postural benigno se diagnostica mediante un examen físico. Si la causa específica de su vértigo posicional benigno es desconocido, su médico puede indicar diagnósticos por imágenes, como la resonancia magnética (RM) o la tomografía computada (TC).  °TRATAMIENTO  °El médico le podrá recomendar movimientos o procedimientos para corregir el vértigo posicional benigno. Para tratar los síntomas pueden indicarse medicamentos como meclizina, benzodiazepinas y medicamentos para las náuseas. En casos raros, si los síntomas son causados   por ciertos trastornos que afectan el oído interno, es posible que necesite cirugía.  °INSTRUCCIONES PARA EL CUIDADO EN EL HOGAR  °· Siga las indicaciones del médico. °· Muévase lentamente. No haga movimientos bruscos con la cabeza ni el cuerpo. °· Evite conducir vehículos. °· Evite operar maquinarias pesadas. °· Evite realizar tareas que serían peligrosas para usted u otras personas durante un episodio de vértigo. °· Debe ingerir gran cantidad de líquido para mantener la orina de tono claro o color amarillo pálido. °SOLICITE ATENCIÓN MÉDICA DE INMEDIATO  SI:  °· Tiene dificultad para hablar, caminar, siente debilidad o tiene problemas para usar los brazos, las manos o las piernas. °· Tiene dificultad para respirar. °· Sufre un dolor de cabeza intenso. °· Las náuseas o los vómitos persisten o empeoran. °· Tiene cambios en la visión. °· Sus familiares o amigos notan cambios en su conducta. °· El dolor empeora. °· Tiene fiebre. °· Comienza a sentir rigidez en el cuello o sensibilidad a la luz. °ASEGÚRESE DE QUE:  °· Comprende estas instrucciones. °· Controlará su enfermedad. °· Solicitará ayuda de inmediato si no mejora o si empeora. °Document Released: 12/12/2008 Document Revised: 11/18/2011 °ExitCare® Patient Information ©2015 [employer]. This information is not intended to replace advice given to you by your health care provider. Make sure you discuss any questions you have with your health care provider. ° °

## 2016-01-11 IMAGING — RF DG HYSTEROGRAM
3 series · 3 of 3 positions shown · IV contrast (omnipaque)
Comparison: None.

FLUOROSCOPY TIME:  18 seconds

CLINICAL DATA: Infertility

EXAM:
HYSTEROSALPINGOGRAM
TECHNIQUE: Following cleansing of the cervix and vagina with Betadine solution,
a hysterosalpingogram was performed using a 5-French
hysterosalpingogram catheter and Omnipaque 300 contrast. The patient
tolerated the examination without difficulty.

[Series 1: run · 1 of 1 slices shown (1 of 3)]
[im 1/1]
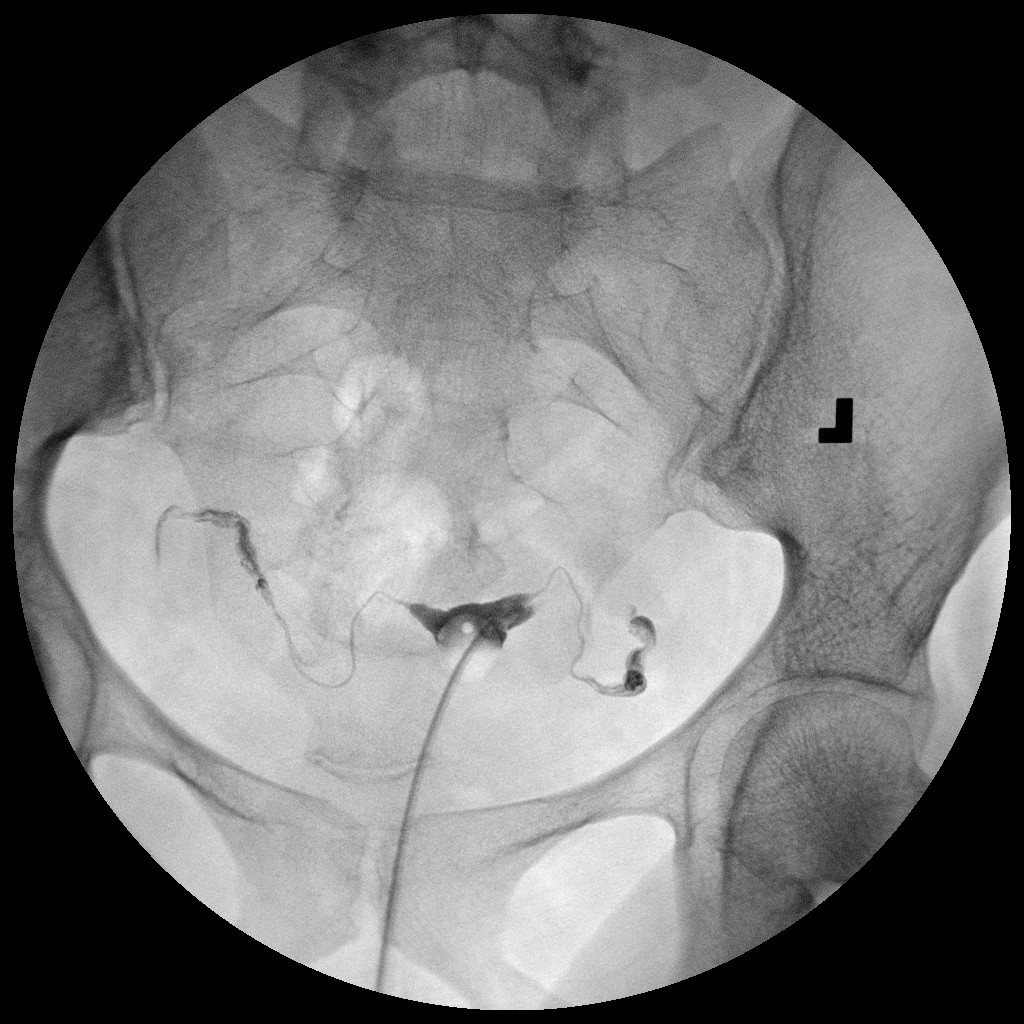

[Series 2: run · 1 of 1 slices shown (2 of 3)]
[im 1/1]
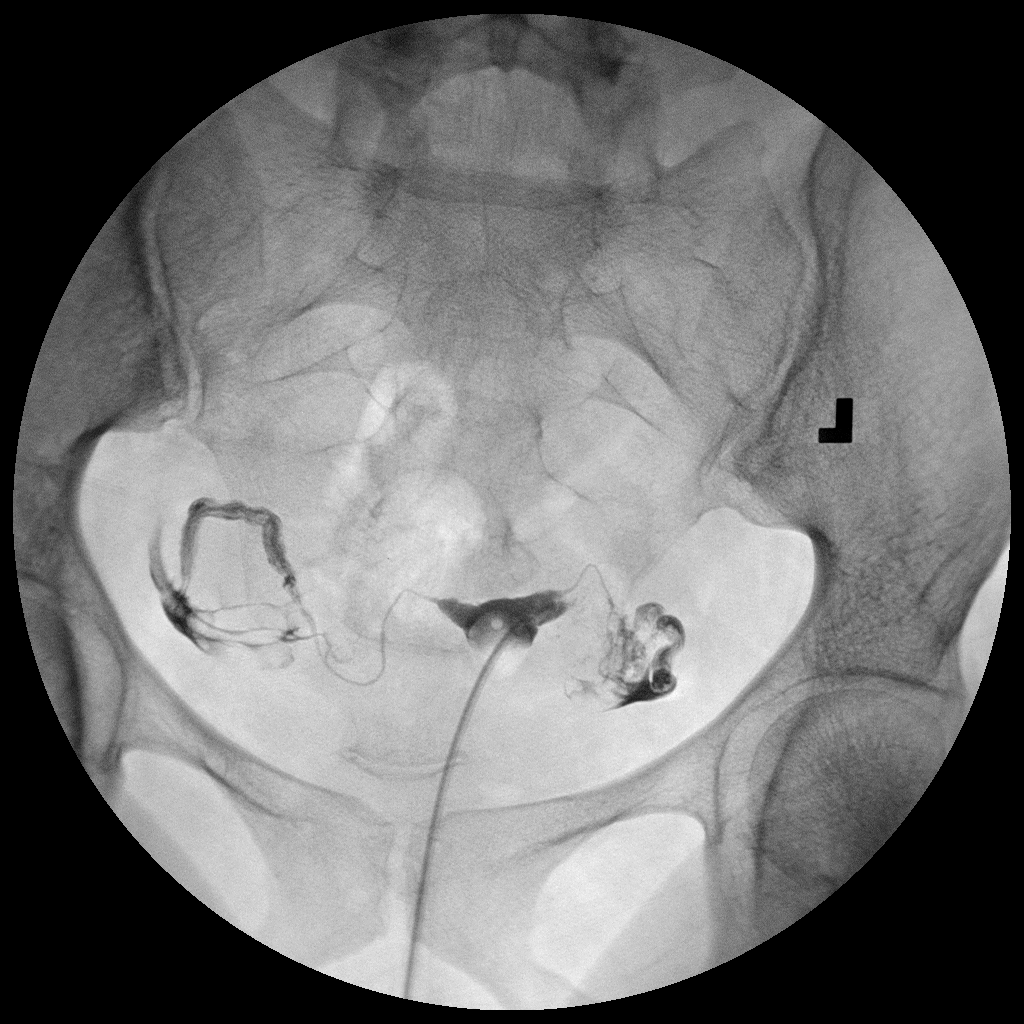

[Series 3: run · 1 of 1 slices shown (3 of 3)]
[im 1/1]
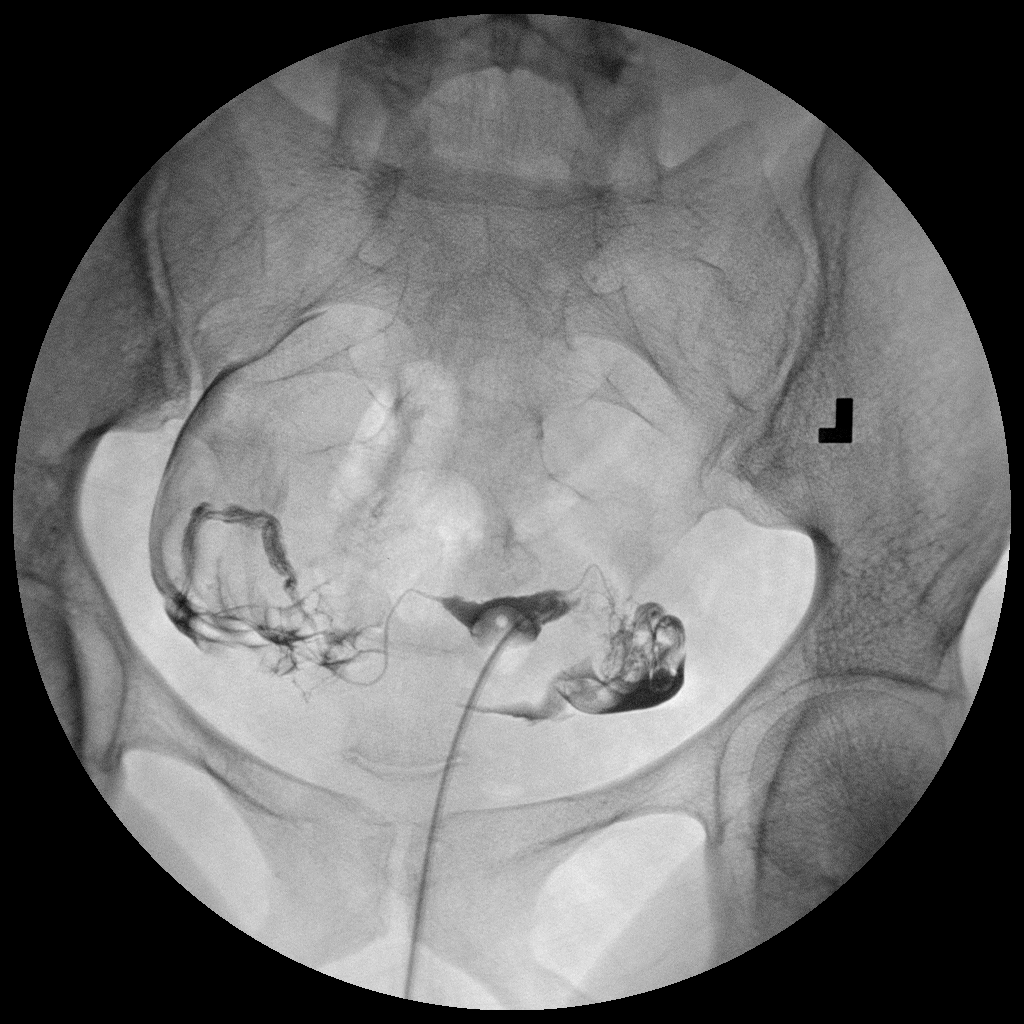

[3 of 3 positions shown; findings below may reference images not displayed]

FINDINGS: Endometrial cavity is normal. Both fallopian tubes fill and have a
normal appearance. Normal spillage bilaterally.
IMPRESSION: Normal study.

## 2017-01-22 ENCOUNTER — Encounter: Payer: Self-pay | Admitting: Gynecology

## 2017-02-15 IMAGING — DX DG CHEST 2V
2 series · 2 of 2 positions shown · non-contrast
Comparison: None.

CLINICAL DATA: Chest pain and shortness of breath for 4 days.

EXAM:
CHEST  2 VIEW

[chest pa]
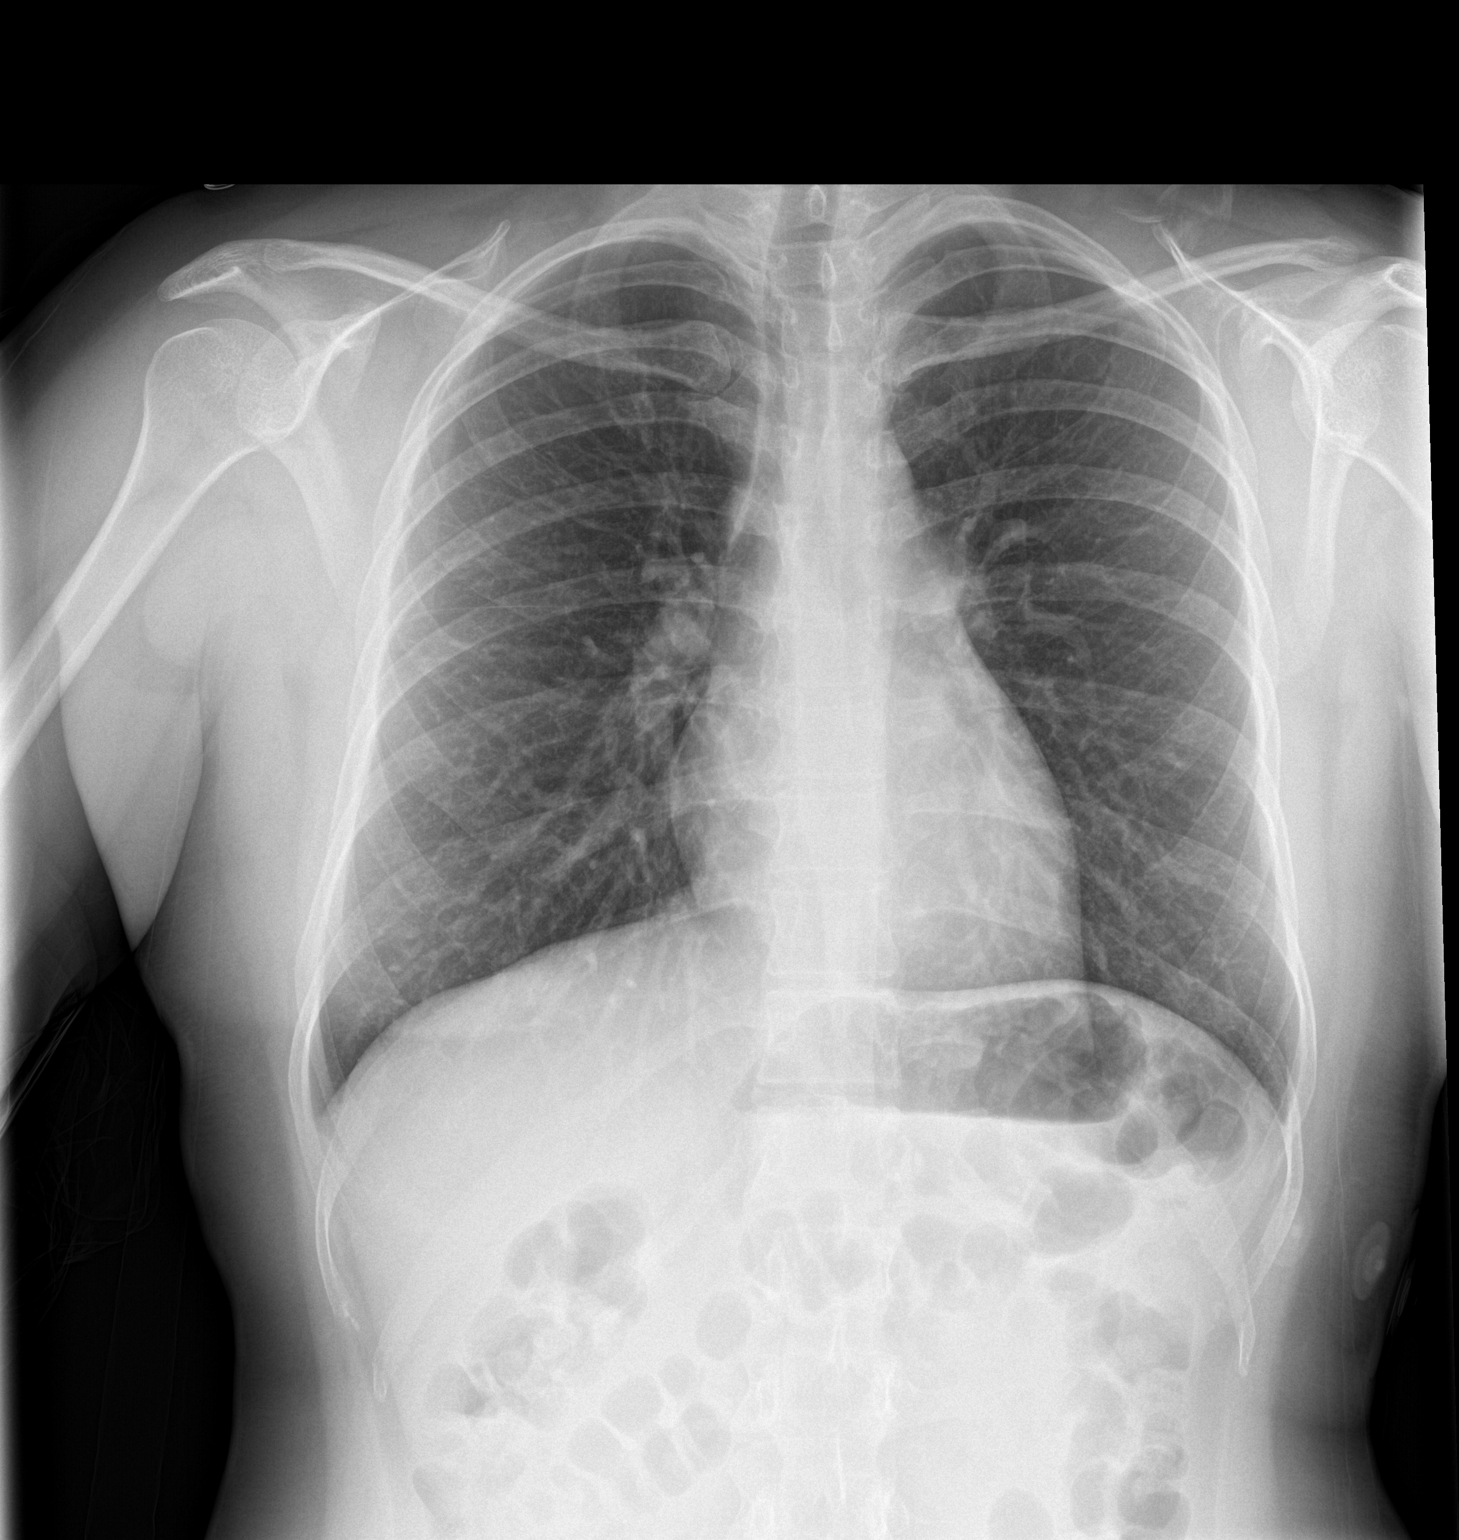

[chest lat]
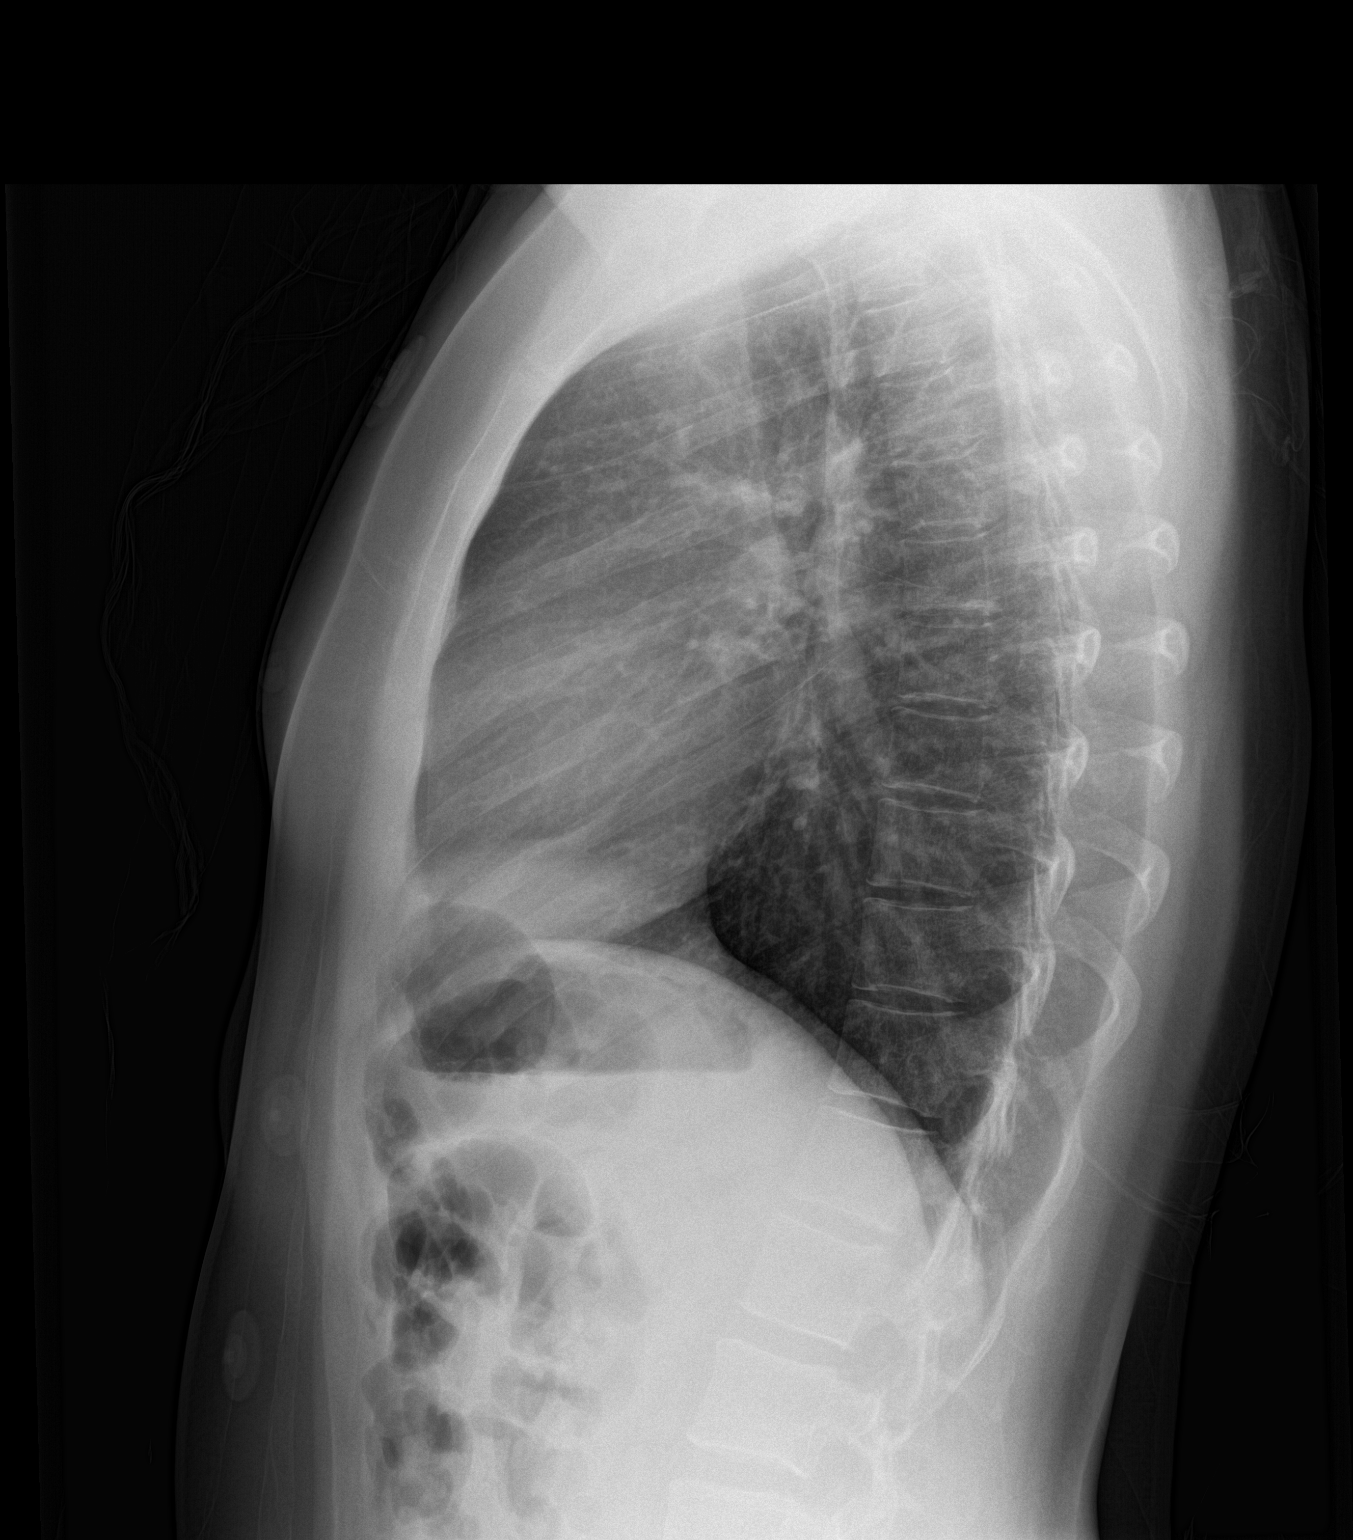

[2 of 2 positions shown; findings below may reference images not displayed]

FINDINGS: The heart size and mediastinal contours are within normal limits.
Both lungs are clear. No evidence of pneumothorax or pleural
effusion. The visualized skeletal structures are unremarkable.
IMPRESSION: Negative.  No active cardiopulmonary disease.

## 2020-01-03 ENCOUNTER — Other Ambulatory Visit: Payer: Self-pay

## 2020-01-03 ENCOUNTER — Ambulatory Visit: Payer: Self-pay | Attending: Internal Medicine

## 2020-01-03 DIAGNOSIS — Z23 Encounter for immunization: Secondary | ICD-10-CM

## 2020-01-03 NOTE — Progress Notes (Signed)
   Covid-19 Vaccination Clinic  Name:  Lesleyanne Politte    MRN: [id number] DOB: Nov 11, 1975  01/03/2020  Ms. Guyette was observed post Covid-19 immunization for 15 minutes without incident. She was provided with Vaccine Information Sheet and instruction to access the V-Safe system.   Ms. Gatchel was instructed to call 911 with any severe reactions post vaccine: Marland Kitchen Difficulty breathing  . Swelling of face and throat  . A fast heartbeat  . A bad rash all over body  . Dizziness and weakness   Immunizations Administered    Name Date Dose VIS Date Route   Pfizer COVID-19 Vaccine 01/03/2020  9:07 AM 0.3 mL 11/03/2018 Intramuscular   Manufacturer: [employer], [employer]   Lot: [id number]   NDC: [address]-5

## 2020-01-25 ENCOUNTER — Ambulatory Visit: Payer: Self-pay | Attending: Internal Medicine

## 2020-01-25 DIAGNOSIS — Z23 Encounter for immunization: Secondary | ICD-10-CM

## 2020-01-25 NOTE — Progress Notes (Signed)
   Covid-19 Vaccination Clinic  Name:  Amber Allison    MRN: [id number] DOB: [birth year]  01/25/2020  Ms. Amber Allison was observed post Covid-19 immunization for 15 minutes without incident. She was provided with Vaccine Information Sheet and instruction to access the V-Safe system.   Ms. Amber Allison was instructed to call 911 with any severe reactions post vaccine: Marland Kitchen Difficulty breathing  . Swelling of face and throat  . A fast heartbeat  . A bad rash all over body  . Dizziness and weakness   Immunizations Administered    Name Date Dose VIS Date Route   Pfizer COVID-19 Vaccine 01/25/2020  9:48 AM 0.3 mL 11/03/2018 Intramuscular   Manufacturer: [employer], [employer]   Lot: [id number]   NDC: [address]-2

## 2020-09-19 ENCOUNTER — Ambulatory Visit: Payer: Self-pay | Attending: Internal Medicine

## 2020-09-19 DIAGNOSIS — Z23 Encounter for immunization: Secondary | ICD-10-CM

## 2020-09-19 NOTE — Progress Notes (Signed)
   Covid-19 Vaccination Clinic  Name:  Amber Allison    MRN: [id number] DOB: Dec 19, 1975  09/19/2020  Amber Allison was observed post Covid-19 immunization for 15 minutes without incident. She was provided with Vaccine Information Sheet and instruction to access the V-Safe system.   Amber Allison was instructed to call 911 with any severe reactions post vaccine: Marland Kitchen Difficulty breathing  . Swelling of face and throat  . A fast heartbeat  . A bad rash all over body  . Dizziness and weakness   Immunizations Administered    Name Date Dose VIS Date Route   Pfizer COVID-19 Vaccine 09/19/2020  1:42 PM 0.3 mL 06/28/2020 Intramuscular   Manufacturer: [employer], [employer]   Lot: [id number]   NDC: [address]-8

## 2022-07-04 ENCOUNTER — Other Ambulatory Visit: Payer: Self-pay | Admitting: Obstetrics & Gynecology

## 2022-07-04 DIAGNOSIS — Z1231 Encounter for screening mammogram for malignant neoplasm of breast: Secondary | ICD-10-CM

## 2022-07-25 ENCOUNTER — Ambulatory Visit
Admission: RE | Admit: 2022-07-25 | Discharge: 2022-07-25 | Disposition: A | Discharge status: Home / Self Care | Payer: No Typology Code available for payment source | Source: Ambulatory Visit | Attending: Obstetrics & Gynecology | Admitting: Obstetrics & Gynecology

## 2022-07-25 DIAGNOSIS — Z1231 Encounter for screening mammogram for malignant neoplasm of breast: Secondary | ICD-10-CM
